# Patient Record
Sex: Female | Born: 1960 | Race: White | Hispanic: No | Smoking: Never smoker
Health system: Southern US, Community
[De-identification: ages and names within clinical notes are randomized; demographics above are authoritative.]

## PROBLEM LIST (undated history)

## (undated) DIAGNOSIS — S0300XA Dislocation of jaw, unspecified side, initial encounter: Secondary | ICD-10-CM

## (undated) DIAGNOSIS — I10 Essential (primary) hypertension: Secondary | ICD-10-CM

## (undated) DIAGNOSIS — M1990 Unspecified osteoarthritis, unspecified site: Secondary | ICD-10-CM

## (undated) HISTORY — PX: TEMPOROMANDIBULAR JOINT SURGERY: SHX35

## (undated) HISTORY — PX: KNEE SURGERY: SHX244

## (undated) HISTORY — PX: EYE SURGERY: SHX253

## (undated) HISTORY — PX: APPENDECTOMY: SHX54

---

## 2012-09-23 ENCOUNTER — Emergency Department (INDEPENDENT_AMBULATORY_CARE_PROVIDER_SITE_OTHER): Payer: BC Managed Care – PPO

## 2012-09-23 ENCOUNTER — Emergency Department
Admission: EM | Admit: 2012-09-23 | Discharge: 2012-09-23 | Disposition: A | Discharge status: Home / Self Care | Payer: BC Managed Care – PPO | Source: Home / Self Care | Attending: Family Medicine | Admitting: Family Medicine

## 2012-09-23 ENCOUNTER — Encounter: Payer: Self-pay | Admitting: *Deleted

## 2012-09-23 DIAGNOSIS — Z0389 Encounter for observation for other suspected diseases and conditions ruled out: Secondary | ICD-10-CM

## 2012-09-23 DIAGNOSIS — X58XXXA Exposure to other specified factors, initial encounter: Secondary | ICD-10-CM

## 2012-09-23 DIAGNOSIS — S51812A Laceration without foreign body of left forearm, initial encounter: Secondary | ICD-10-CM

## 2012-09-23 DIAGNOSIS — Z23 Encounter for immunization: Secondary | ICD-10-CM

## 2012-09-23 DIAGNOSIS — S0181XA Laceration without foreign body of other part of head, initial encounter: Secondary | ICD-10-CM

## 2012-09-23 DIAGNOSIS — S6990XA Unspecified injury of unspecified wrist, hand and finger(s), initial encounter: Secondary | ICD-10-CM

## 2012-09-23 DIAGNOSIS — S59909A Unspecified injury of unspecified elbow, initial encounter: Secondary | ICD-10-CM

## 2012-09-23 DIAGNOSIS — S0180XA Unspecified open wound of other part of head, initial encounter: Secondary | ICD-10-CM

## 2012-09-23 HISTORY — DX: Dislocation of jaw, unspecified side, initial encounter: S03.00XA

## 2012-09-23 HISTORY — DX: Unspecified osteoarthritis, unspecified site: M19.90

## 2012-09-23 MED ORDER — TETANUS-DIPHTH-ACELL PERTUSSIS 5-2.5-18.5 LF-MCG/0.5 IM SUSP
0.5000 mL | Freq: Once | INTRAMUSCULAR | Status: AC
  Administered 2012-09-23: 0.5 mL via INTRAMUSCULAR

## 2012-09-23 NOTE — ED Provider Notes (Signed)
CSN: [id number]     Arrival date & time 09/23/12  1339 History     First MD Initiated Contact with Patient 09/23/12 1348     Chief Complaint  Patient presents with  . Arm Injury  . Facial Injury    HPI  L forearm and face injury x 2 days Pt was trying to remove storm window at one of her properties and window broke.  Had pieces of glass to fall on her.  Fell on L forearm and face  Mild pain  No numbness  Has had some pain with wrist flexion and extension No loss of ROM Neurovascularly intact.    No past medical history on file. No past surgical history on file. No family history on file. History  Substance Use Topics  . Smoking status: Not on file  . Smokeless tobacco: Not on file  . Alcohol Use: Not on file   OB History   No data available     Review of Systems  All other systems reviewed and are negative.    Allergies  Review of patient's allergies indicates not on file.  Home Medications  No current outpatient prescriptions on file. BP 164/93  Pulse 86  Temp(Src) 98.2 F (36.8 C) (Oral)  Resp 16  Wt 123 lb (55.792 kg)  SpO2 99% Physical Exam  Constitutional: She appears well-developed and well-nourished.  HENT:  Head: Normocephalic and atraumatic.    Faint <0.5 cm facial laceration  Minimal peripheral swelling    Eyes: Conjunctivae are normal. Pupils are equal, round, and reactive to light.  Neck: Normal range of motion.  Cardiovascular: Normal rate and regular rhythm.   Pulmonary/Chest: Effort normal and breath sounds normal.  Abdominal: Soft.  Musculoskeletal:       Arms: Mild L radial sided mid forearm laceration <0.5 in length.  Mild peripheral swelling Full wrist ROM  Neurovascularly intact  MIld TTP over affected area   Neurological: She is alert.  Skin: Skin is warm.    ED Course   Procedures (including critical care time)  Labs Reviewed - No data to display Dg Facial Bones 1-2 Views  09/23/2012   *RADIOLOGY REPORT*   Clinical Data: Looking for glass over left face.  FACIAL BONES - 1-2 VIEW  Comparison: None.  Findings: Paranasal sinuses are well-developed and well-aerated. There is no acute fracture.  No definite radiopaque foreign body is seen within the soft tissues.  There are degenerative changes of the spine.  IMPRESSION: No evidence of radiopaque foreign body.  No acute findings.   Original Report Authenticated By: Elberta Fortis, M.D.   Dg Forearm Left  09/23/2012   *RADIOLOGY REPORT*  Clinical Data:  left forearm injury with glass, rule out foreign body.  LEFT FOREARM - 2 VIEW  Comparison: None.  Findings: Two views of the left forearm submitted.  No acute fracture or subluxation.  No radiopaque foreign body is identified. Mild soft tissue swelling adjacent to distal radial region.  IMPRESSION: No acute fracture or subluxation.  No radiopaque foreign body is identified.   Original Report Authenticated By: Natasha Mead, M.D.   1. Facial laceration, initial encounter   2. Forearm laceration, left, initial encounter     MDM  No foreign bodies present on imaging  Discussed general care and MSK/derm/infectious red flags Affected areas cleansed at bedside.  Tdap given.  Follow up as needed.     The patient and/or caregiver has been counseled thoroughly with regard to treatment plan and/or medications prescribed  including dosage, schedule, interactions, rationale for use, and possible side effects and they verbalize understanding. Diagnoses and expected course of recovery discussed and will return if not improved as expected or if the condition worsens. Patient and/or caregiver verbalized understanding.       Doree Albee, MD 09/23/12 [phone]

## 2012-09-23 NOTE — ED Notes (Signed)
Pt reports that a window fell onto her LT forearm and LT side of her face x 1 day ago causing a small laceration and swelling. Tdap will be given today.

## 2013-04-06 ENCOUNTER — Emergency Department
Admission: EM | Admit: 2013-04-06 | Discharge: 2013-04-06 | Disposition: A | Discharge status: Home / Self Care | Payer: BC Managed Care – PPO | Source: Home / Self Care | Attending: Emergency Medicine | Admitting: Emergency Medicine

## 2013-04-06 ENCOUNTER — Emergency Department (INDEPENDENT_AMBULATORY_CARE_PROVIDER_SITE_OTHER): Payer: BC Managed Care – PPO

## 2013-04-06 ENCOUNTER — Encounter: Payer: Self-pay | Admitting: Emergency Medicine

## 2013-04-06 DIAGNOSIS — S0003XA Contusion of scalp, initial encounter: Secondary | ICD-10-CM

## 2013-04-06 DIAGNOSIS — R221 Localized swelling, mass and lump, neck: Secondary | ICD-10-CM

## 2013-04-06 DIAGNOSIS — S1093XA Contusion of unspecified part of neck, initial encounter: Secondary | ICD-10-CM

## 2013-04-06 DIAGNOSIS — S0083XA Contusion of other part of head, initial encounter: Secondary | ICD-10-CM

## 2013-04-06 DIAGNOSIS — R220 Localized swelling, mass and lump, head: Secondary | ICD-10-CM

## 2013-04-06 DIAGNOSIS — S0990XA Unspecified injury of head, initial encounter: Secondary | ICD-10-CM

## 2013-04-06 MED ORDER — IBUPROFEN 200 MG PO TABS
ORAL_TABLET | ORAL | Status: DC
Start: 2013-04-06 — End: 2015-05-14

## 2013-04-06 NOTE — ED Provider Notes (Addendum)
CSN: [id number]     Arrival date & time 04/06/13  1516 History   First MD Initiated Contact with Patient 04/06/13 1538     Chief Complaint  Patient presents with  . Facial Injury    Patient is a 53 y.o. female presenting with facial injury. The history is provided by the patient.  Facial Injury Mechanism of injury:  Fall Location:  Face, L cheek, R cheek and chin (And left orbit) Time since incident:  7 days Pain details:    Quality:  Sharp   Severity:  Severe   Timing:  Constant   Progression:  Worsening Chronicity:  New Foreign body present:  No foreign bodies Relieved by:  Nothing Ineffective treatments:  None tried Associated symptoms: no altered mental status, no congestion, no difficulty breathing, no double vision, no ear pain, no epistaxis, no headaches, no loss of consciousness, no malocclusion, no nausea, no neck pain, no rhinorrhea, no trismus, no vomiting and no wheezing   Associated symptoms comment:  Left eye/orbit pain and swelling Risk factors: no concern for non-accidental trauma (I questioned her, and she specifically denied any assault or abuse history)    Meghan SquibbJane reports falling 7 days Hale @ night tripping over a rug and hit the left facial area. Reports no loss of consciousness or visual changes. She has bruising under both orbits and on her left jaw.   Past Medical History  Diagnosis Date  . TMJ (dislocation of temporomandibular joint)   . Arthritis    Past Surgical History  Procedure Laterality Date  . Knee surgery    . Eye surgery    . Temporomandibular joint surgery    . Appendectomy     Family History  Problem Relation Age of Onset  . Parkinson's disease Mother    History  Substance Use Topics  . Smoking status: Never Smoker   . Smokeless tobacco: Not on file  . Alcohol Use: Yes   OB History   Grav Para Term Preterm Abortions TAB SAB Ect Mult Living                 Review of Systems  HENT: Negative for congestion, ear pain, nosebleeds and  rhinorrhea.   Eyes: Negative for double vision.  Respiratory: Negative for wheezing.   Gastrointestinal: Negative for nausea and vomiting.  Musculoskeletal: Negative for neck pain.  Neurological: Negative for loss of consciousness and headaches.  All other systems reviewed and are negative.      Allergies  Codeine and Prednisone  Home Medications   Current Outpatient Rx  Name  Route  Sig  Dispense  Refill  . ibuprofen (ADVIL,MOTRIN) 200 MG tablet      Take three tablets ( 600 milligrams total) every 6 with food as needed for pain.   30 tablet   0    BP 167/96  Pulse 92  Resp 16  Wt 125 lb (56.7 kg)  SpO2 98% Physical Exam  Nursing note and vitals reviewed. Constitutional: She is oriented to person, place, and time. She appears well-developed and well-nourished. No distress.  Uncomfortable from facial pain.  She has prominent ecchymosis diffusely on face  HENT:  Head: Normocephalic. Head is with raccoon's eyes.    Right Ear: Ear canal normal. No drainage or swelling.  Left Ear: Ear canal normal. No drainage or swelling.  Nose: Nose normal.  Mouth/Throat: Oropharynx is clear and moist.  Ecchymosis and swelling as depicted. Especially tender over superior left orbit. Globes intact  Eyes: Conjunctivae  and EOM are normal. Pupils are equal, round, and reactive to light. Right eye exhibits no discharge. Left eye exhibits no discharge. Right conjunctiva has no hemorrhage. Left conjunctiva has no hemorrhage. No scleral icterus.  Fundoscopic exam:      The right eye shows no exudate, no hemorrhage and no papilledema.       The left eye shows no exudate, no hemorrhage and no papilledema.    Swollen, ecchymotic, as depicted  Neck: Normal range of motion and full passive range of motion without pain. Neck supple. No tracheal tenderness, no spinous process tenderness and no muscular tenderness present. No tracheal deviation and normal range of motion present. No mass present.   Cardiovascular: Normal rate and regular rhythm.   Pulmonary/Chest: Effort normal.  Abdominal: Soft. She exhibits no distension. There is no tenderness.  Musculoskeletal: Normal range of motion.  No deformity or tenderness or swelling of extremities  Neurological: She is alert and oriented to person, place, and time. She has normal strength. No cranial nerve deficit or sensory deficit. Gait normal.  Skin: Skin is warm. No laceration and no rash noted.  Psychiatric: She has a normal mood and affect. Her behavior is normal.   Visual acuity tested each eye, within normal limits each eye  ED Course  Procedures (including critical care time) Labs Review Labs Reviewed - No data to display Imaging Review Ct Maxillofacial Wo Cm  04/06/2013   CLINICAL DATA:  Hit face on bed frame.  Pain.  Bruising.  EXAM: CT MAXILLOFACIAL WITHOUT CONTRAST  TECHNIQUE: Multidetector CT imaging of the maxillofacial structures was performed. Multiplanar CT image reconstructions were also generated. A small vitamin-E capsule was placed on the right temple in order to reliably differentiate right from left.  COMPARISON:  None.  FINDINGS: There is no visible facial fracture. There is a large scalp hematoma in the left supraorbital region measuring 11 x 25 mm cross-section as seen on image 11 series 2. There is no underlying skull fracture. Within limits of visualization of the intracranial compartment, no intracranial hemorrhage although premature for age cerebral atrophy is apparent.  The globes are intact and there is no postseptal hemorrhage. Mild preseptal periorbital soft tissue swelling is noted on the left.  No sinus air-fluid level is evident. There is no apparent blowout injury. Nasal bones are intact. No nasal cavity masses. Mild malar soft tissue swelling on the left. TMJs appear located. Within limits of detection due to dental amalgam, no mandibular or maxillary fracture is evident.  IMPRESSION: Soft tissue swelling  with supraorbital scalp hematoma on the left face. No underlying skull fracture or facial fracture. No visible blowout injury or sinus air-fluid level. No postseptal orbital hematoma or visible globe injury.  Premature for age cerebral atrophy is incompletely evaluated on this study, but there is no visible intracranial hemorrhage in the anterior cranial vault.   Electronically Signed   By: Davonna Belling M.D.   On: 04/06/2013 16:49     4:53 PM Discussed indications for imaging with patient. She agrees with my advice of ordering CT of face.--Facial CT ordered.  MDM   Final diagnoses:  Facial contusion  Head injury, closed    Reviewed with patient that CT of head shows no acute intracranial abnormality. There is soft tissue swelling with supraorbital scalp hematoma on the left face. We discussed the treatment for this is symptomatic and the swelling and ecchymosis will resolve with time. Nonpharmacologic measures discussed She declined any prescription pain medication, but may  use ibuprofen up to 600 mg 3 times a day when necessary pain. Follow-up with ENT or your primary care doctor in 3-5 days if not improving, or sooner if symptoms become worse. Precautions discussed. Red flags discussed. Questions invited and answered. Patient voiced understanding and agreement.     Lajean Manes, MD 04/06/13 1655  Lajean Manes, MD 04/06/13 1656  Lajean Manes, MD 04/06/13 [phone]

## 2013-04-06 NOTE — ED Notes (Signed)
Meghan SquibbJane reports falling 8 days ago @ night tripping over a rug and hit the left brown bone. Reports no loss of consciousness or visual changes. She has bruising under both orbits and on her left jaw.

## 2013-11-29 ENCOUNTER — Encounter: Payer: Self-pay | Admitting: Emergency Medicine

## 2013-11-29 ENCOUNTER — Emergency Department
Admission: EM | Admit: 2013-11-29 | Discharge: 2013-11-29 | Disposition: A | Discharge status: Home / Self Care | Payer: BC Managed Care – PPO | Source: Home / Self Care | Attending: Emergency Medicine | Admitting: Emergency Medicine

## 2013-11-29 DIAGNOSIS — B029 Zoster without complications: Secondary | ICD-10-CM

## 2013-11-29 MED ORDER — VALACYCLOVIR HCL 1 G PO TABS: ORAL_TABLET | ORAL | Status: DC

## 2013-11-29 MED ORDER — METHYLPREDNISOLONE 4 MG PO KIT: PACK | ORAL | Status: DC

## 2013-11-29 NOTE — ED Provider Notes (Signed)
CSN: [id number]     Arrival date & time 11/29/13  1453 History   First MD Initiated Contact with Patient 11/29/13 1506     Chief Complaint  Patient presents with  . Rash   (Consider location/radiation/quality/duration/timing/severity/associated sxs/prior Treatment) Patient is a 53 y.o. female presenting with rash. The history is provided by the patient.  Rash Pain location:  L flank Pain quality comment:  Mild discomfort, mild itch Pain radiation: Along left flank dermatome. Pain severity:  Mild Onset quality:  Gradual Duration:  2 days Progression:  Worsening Chronicity:  New Context: not recent illness, not recent travel, not sick contacts, not suspicious food intake and not trauma   Relieved by:  Nothing Associated symptoms: fatigue   Associated symptoms: no chest pain, no chills, no cough, no diarrhea, no dysuria, no fever, no hematemesis, no hematochezia, no hematuria, no melena, no nausea, no shortness of breath, no sore throat, no vaginal bleeding and no vomiting     Past Medical History  Diagnosis Date  . TMJ (dislocation of temporomandibular joint)   . Arthritis    Past Surgical History  Procedure Laterality Date  . Knee surgery    . Eye surgery    . Temporomandibular joint surgery    . Appendectomy     Family History  Problem Relation Age of Onset  . Parkinson's disease Mother    History  Substance Use Topics  . Smoking status: Never Smoker   . Smokeless tobacco: Not on file  . Alcohol Use: Yes   OB History   Grav Para Term Preterm Abortions TAB SAB Ect Mult Living                 Review of Systems  Constitutional: Positive for fatigue. Negative for fever and chills.  HENT: Negative for sore throat.   Respiratory: Negative for cough and shortness of breath.   Cardiovascular: Negative for chest pain.  Gastrointestinal: Negative for nausea, vomiting, diarrhea, melena, hematochezia and hematemesis.  Genitourinary: Negative for dysuria, hematuria and  vaginal bleeding.  Skin: Positive for rash.  All other systems reviewed and are negative.   Allergies  Codeine and Prednisone  Home Medications   Prior to Admission medications   Medication Sig Start Date End Date Taking? Authorizing Provider  ibuprofen (ADVIL,MOTRIN) 200 MG tablet Take three tablets ( 600 milligrams total) every 6 with food as needed for pain. 04/06/13   Lajean Manes, MD  methylPREDNISolone (MEDROL DOSEPAK) 4 MG tablet Take as directed for 6 days 11/29/13   Lajean Manes, MD  valACYclovir (VALTREX) 1000 MG tablet Take one by mouth 3 times a day for 7 days 11/29/13   Lajean Manes, MD   BP 147/82  Pulse 90  Temp(Src) 98 F (36.7 C) (Oral)  Resp 16  Ht 5\' 4"  (1.626 m)  Wt 123 lb (55.792 kg)  BMI 21.10 kg/m2  SpO2 100% Physical Exam  Nursing note and vitals reviewed. Constitutional: She is oriented to person, place, and time. She appears well-developed and well-nourished. No distress.  HENT:  Head: Normocephalic and atraumatic.  Eyes: Conjunctivae and EOM are normal. Pupils are equal, round, and reactive to light. No scleral icterus.  Neck: Normal range of motion.  Cardiovascular: Normal rate.   Pulmonary/Chest: Effort normal.  Abdominal: She exhibits no distension.  Musculoskeletal: Normal range of motion.  Neurological: She is alert and oriented to person, place, and time.  Skin: Skin is warm. Rash noted.     Herpetiform, slight vesicular, red eruption in  dermatome distribution left flank. It does not cross midline  Psychiatric: She has a normal mood and affect.    ED Course  Procedures (including critical care time) Labs Review Labs Reviewed - No data to display  Imaging Review No results found.   MDM   1. Shingles    Over 30 minutes spent, greater than 50% of the time spent for counseling and coordination of care. Treatment options discussed, as well as risks, benefits, alternatives. Patient voiced understanding and agreement with the following  plans: Valtrex 1 g 3 times a day x7 days. Pros and cons of steroids to help prevent postherpetic neuralgia. Carefully reviewed with her that in the past she once had some mild palpitations while on high-dose prednisone for a long period of time, but that resolved without sequelae. After lengthy discussion, of pros and cons, she requests Medrol Dosepak. Tylenol or ibuprofen for pain. She declined any prescription pain medication. Handout given. Advised to get the shingles vaccine once this is resolved. Follow-up with your primary care doctor in 5-7 days if not improving, or sooner if symptoms become worse. Precautions discussed. Red flags discussed. Questions invited and answered. Patient voiced understanding and agreement.      Lajean Manesavid Massey, MD 11/29/13 [phone]

## 2013-11-29 NOTE — ED Notes (Signed)
Pt c/o rash on the LT side of her abdomen x 4 days.

## 2014-08-22 IMAGING — CR DG FACIAL BONES 1-2V
3 series · 3 of 3 positions shown · non-contrast
Comparison: None.

CLINICAL DATA: Looking for glass over left face.

FACIAL BONES - 1-2 VIEW

[view not recorded (1 of 3)]
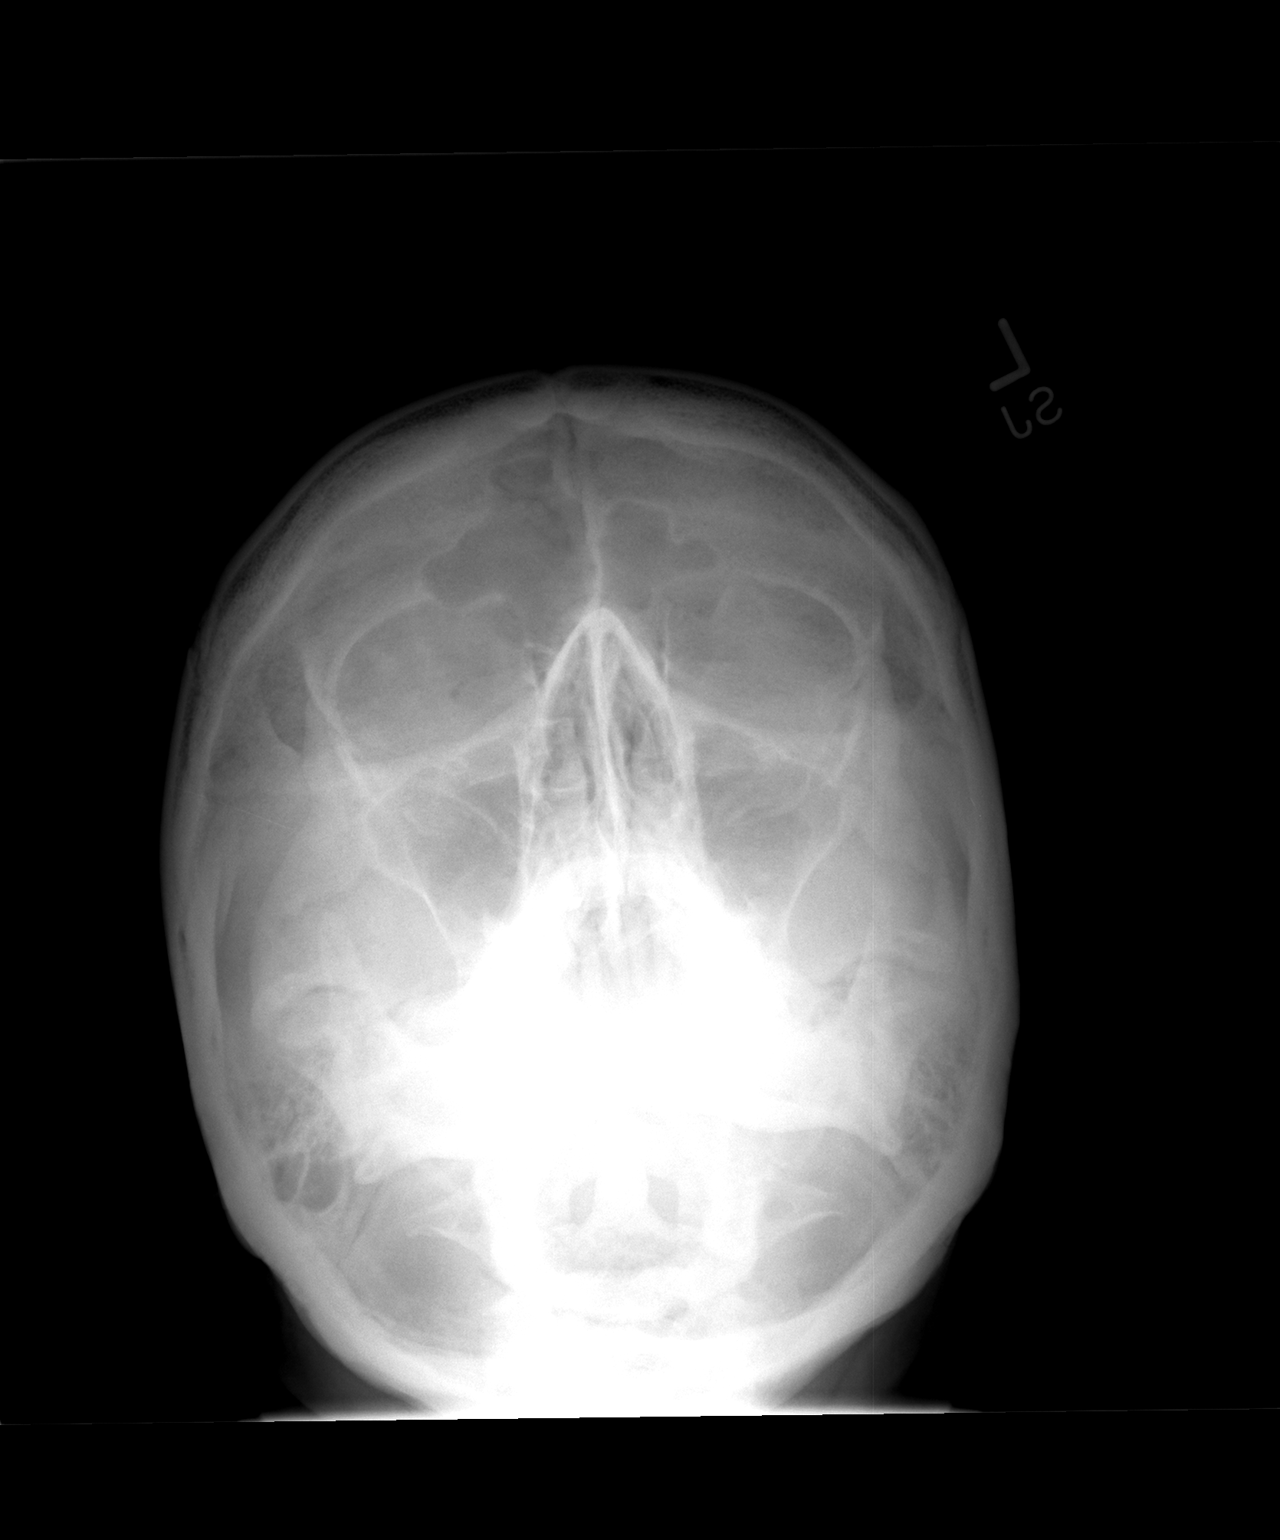

[view not recorded (2 of 3)]
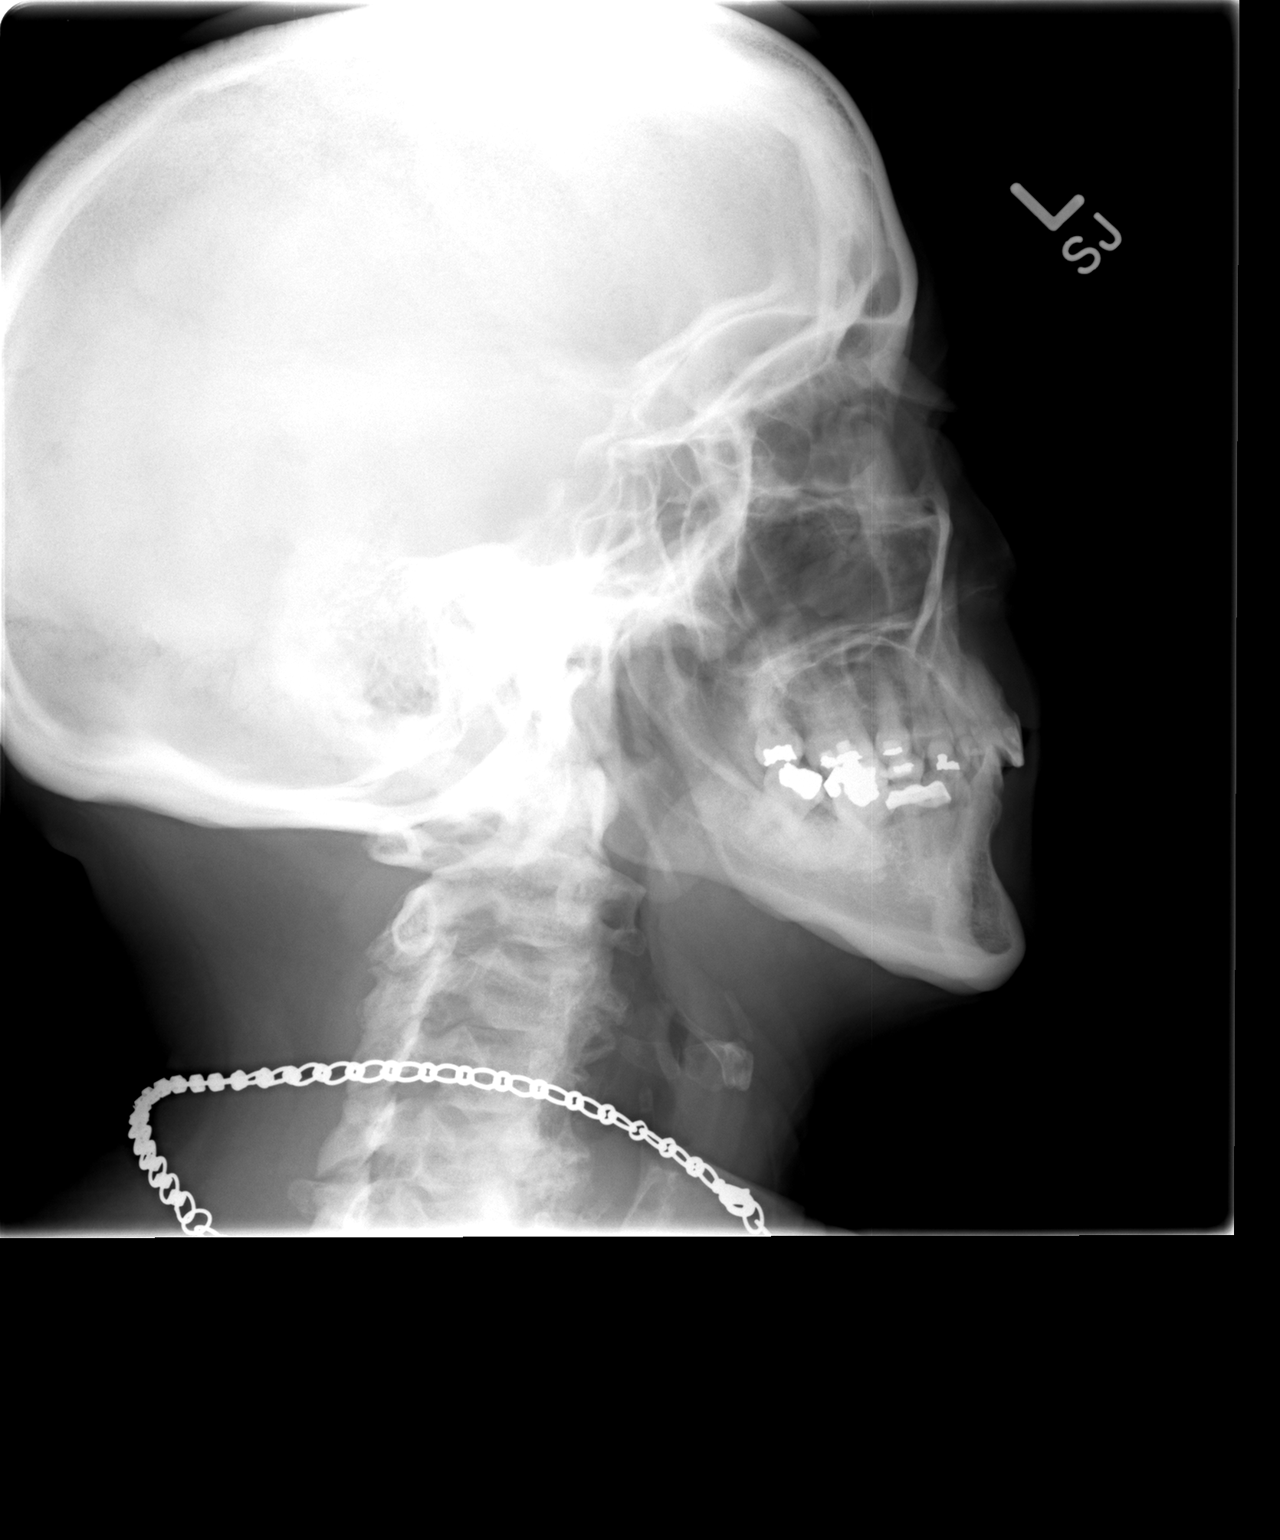

[view not recorded (3 of 3)]
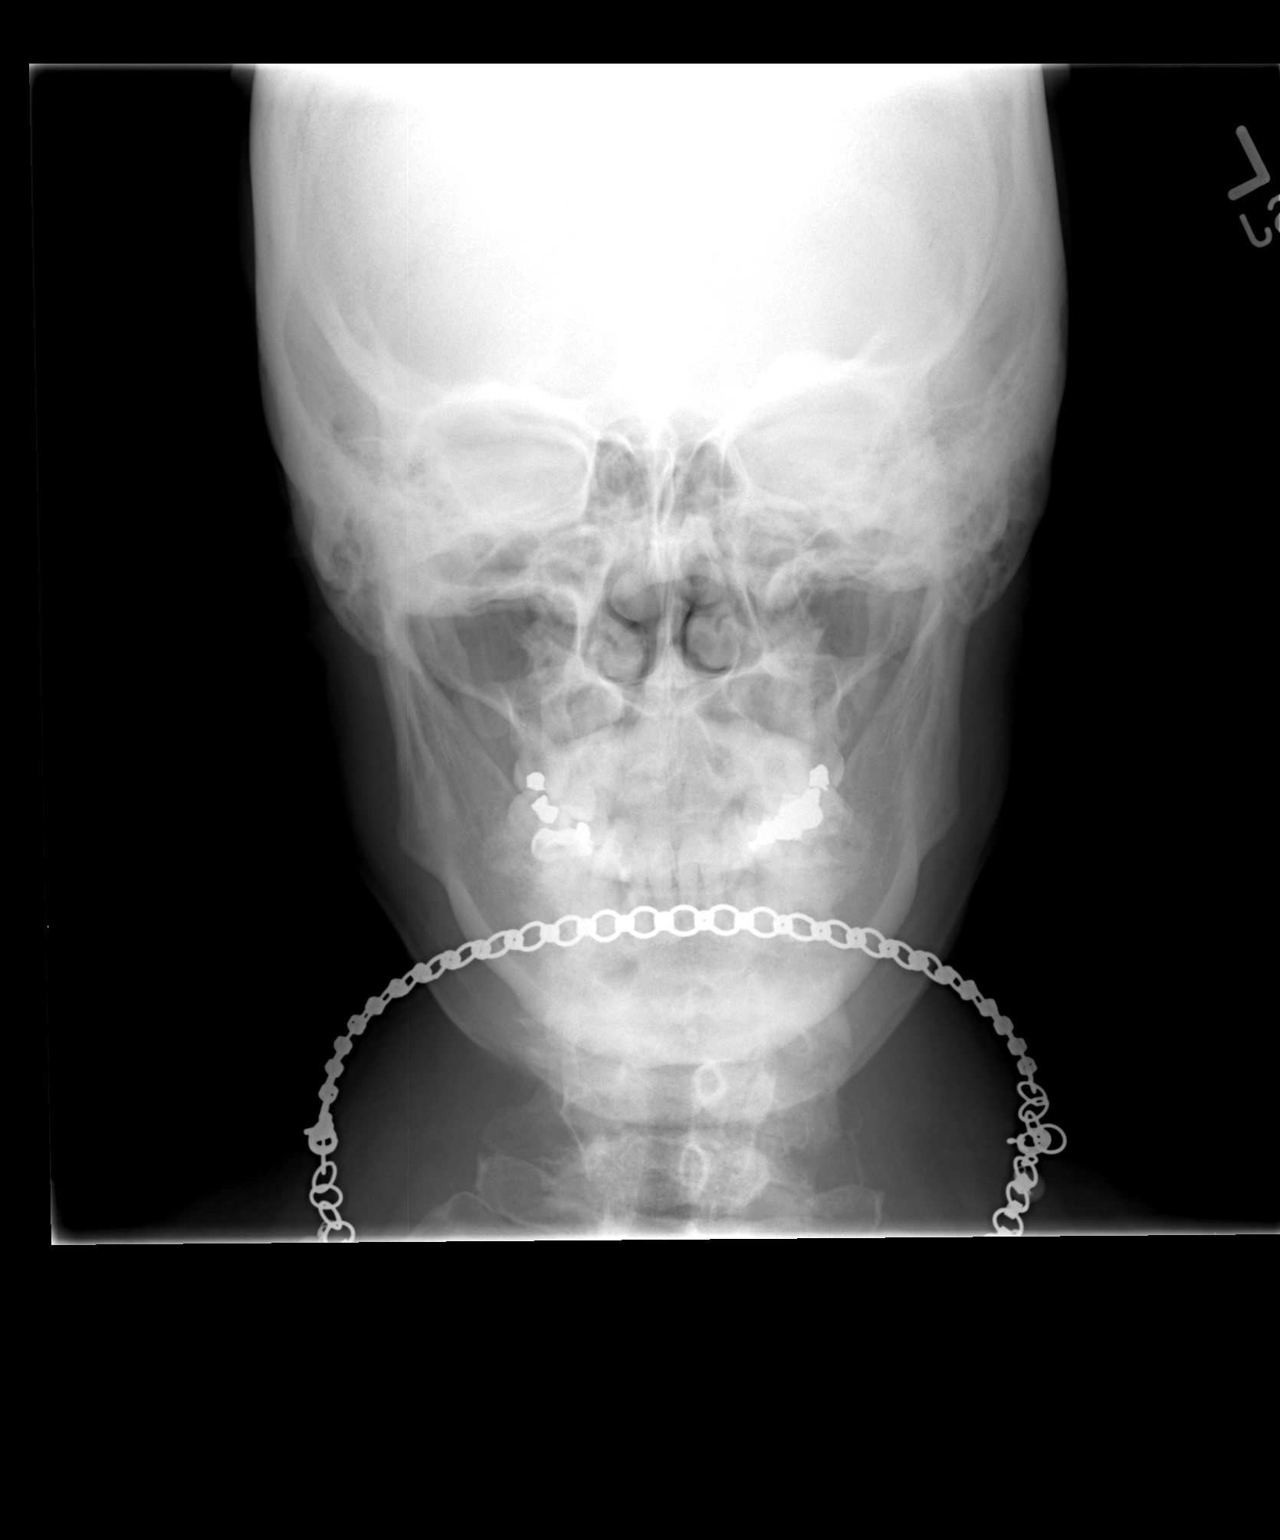

[3 of 3 positions shown; findings below may reference images not displayed]

FINDINGS: Paranasal sinuses are well-developed and well-aerated.
There is no acute fracture.  No definite radiopaque foreign body is
seen within the soft tissues.  There are degenerative changes of
the spine.
IMPRESSION: No evidence of radiopaque foreign body.  No acute findings.

## 2015-05-14 ENCOUNTER — Emergency Department (INDEPENDENT_AMBULATORY_CARE_PROVIDER_SITE_OTHER): Payer: BLUE CROSS/BLUE SHIELD

## 2015-05-14 ENCOUNTER — Encounter: Payer: Self-pay | Admitting: Emergency Medicine

## 2015-05-14 ENCOUNTER — Emergency Department
Admission: EM | Admit: 2015-05-14 | Discharge: 2015-05-14 | Disposition: A | Discharge status: Home / Self Care | Payer: BLUE CROSS/BLUE SHIELD | Source: Home / Self Care

## 2015-05-14 DIAGNOSIS — J111 Influenza due to unidentified influenza virus with other respiratory manifestations: Secondary | ICD-10-CM | POA: Diagnosis not present

## 2015-05-14 DIAGNOSIS — R05 Cough: Secondary | ICD-10-CM | POA: Diagnosis not present

## 2015-05-14 DIAGNOSIS — R0781 Pleurodynia: Secondary | ICD-10-CM | POA: Diagnosis not present

## 2015-05-14 DIAGNOSIS — R69 Illness, unspecified: Principal | ICD-10-CM

## 2015-05-14 MED ORDER — AZITHROMYCIN 250 MG PO TABS: ORAL_TABLET | ORAL | Status: DC

## 2015-05-14 MED ORDER — BENZONATATE 200 MG PO CAPS: 200.0000 mg | ORAL_CAPSULE | Freq: Every day | ORAL | Status: DC

## 2015-05-14 NOTE — ED Provider Notes (Signed)
CSN: [id number]     Arrival date & time 05/14/15  1144 History   None    Chief Complaint  Patient presents with  . Cough      HPI Comments: Complains of one week history of flu-like illness including myalgias, headache, fever/chills, fatigue, and cough.  Also has mild nasal congestion and sore throat.  Cough is non-productive and somewhat worse at night.  She has developed pleuritic pain in her right anterior/inferior ribs.     The history is provided by the patient.    Past Medical History  Diagnosis Date  . TMJ (dislocation of temporomandibular joint)   . Arthritis    Past Surgical History  Procedure Laterality Date  . Knee surgery    . Eye surgery    . Temporomandibular joint surgery    . Appendectomy     Family History  Problem Relation Age of Onset  . Parkinson's disease Mother    Social History  Substance Use Topics  . Smoking status: Never Smoker   . Smokeless tobacco: None  . Alcohol Use: Yes   OB History    No data available     Review of Systems + sore throat + cough + pleuritic pain No wheezing + nasal congestion + post-nasal drainage + sinus pain/pressure No itchy/red eyes ? earache No hemoptysis No SOB + fever, + chills No nausea + vomiting, resolved No abdominal pain + diarrhea, resolved No urinary symptoms No skin rash + fatigue No myalgias + headache Used OTC meds without relief  Allergies  Codeine and Prednisone  Home Medications   Prior to Admission medications   Medication Sig Start Date End Date Taking? Authorizing Provider  azithromycin (ZITHROMAX Z-PAK) 250 MG tablet Take 2 tabs today; then begin one tab once daily for 4 more days. 05/14/15   Lattie HawStephen A Fadel Clason, MD  benzonatate (TESSALON) 200 MG capsule Take 1 capsule (200 mg total) by mouth at bedtime. Take as needed for cough 05/14/15   Lattie HawStephen A Smayan Hackbart, MD   Meds Ordered and Administered this Visit  Medications - No data to display  BP 148/91 mmHg  Pulse 83  Temp(Src) 97.7  F (36.5 C) (Oral)  Ht 5\' 4"  (1.626 m)  Wt 119 lb (53.978 kg)  BMI 20.42 kg/m2  SpO2 100% No data found.   Physical Exam  Constitutional: She is oriented to person, place, and time. She appears well-developed and well-nourished. No distress.  HENT:  Head: Normocephalic.  Right Ear: Tympanic membrane, external ear and ear canal normal.  Left Ear: Tympanic membrane, external ear and ear canal normal.  Nose: Nose normal.  Mouth/Throat: Oropharynx is clear and moist.  Eyes: Conjunctivae and EOM are normal. Pupils are equal, round, and reactive to light.  Neck: Neck supple.  Tender posterior/lateral nodes bilaterally  Cardiovascular: Normal heart sounds.   Pulmonary/Chest: Breath sounds normal.    There is mild swelling, tenderness, ecchymosis right anterior inferior ribs as noted on diagram.   Faint wheezes right posterior inferior chest.    Abdominal: There is no tenderness.  Musculoskeletal: She exhibits no edema.  Neurological: She is alert and oriented to person, place, and time.  Skin: Skin is warm and dry. No rash noted. She is not diaphoretic.  Nursing note and vitals reviewed.   ED Course  Procedures none   Imaging Review Dg Ribs Unilateral W/chest Right  05/14/2015  CLINICAL DATA:  Productive cough for the past week. Right chest pain beneath the breast. EXAM: RIGHT RIBS AND  CHEST - 3+ VIEW COMPARISON:  None. FINDINGS: Normal sized heart. Clear lungs. Normal appearing ribs. Mild-to-moderate right AC joint inferior spur formation. IMPRESSION: No acute abnormality. Electronically Signed   By: Beckie Salts M.D.   On: 05/14/2015 14:24      MDM   1. Influenza-like illness   2. Rib pain on right side    Begin Z-pak for atypical coverage (missed window of opportunity for Tamiflu). Prescription written for Benzonatate [facility]) to take at bedtime for night-time cough.  Apply ice pack to right chest for 20 to 30 minutes, 3 to 4 times daily  Continue until pain and  swelling decrease. Take plain guaifenesin (  extended release tabs such as Mucinex) twice daily, with plenty of water, for cough and congestion.  May add Pseudoephedrine ( , one or two every 4 to 6 hours) for sinus congestion.  Get adequate rest.   May use Afrin nasal spray (or generic oxymetazoline) twice daily for about 5 days and then discontinue.  Also recommend using saline nasal spray several times daily and saline nasal irrigation (AYR is a common brand).  Use Flonase nasal spray each morning after using Afrin nasal spray and saline nasal irrigation. Try warm salt water gargles for sore throat.  Stop all antihistamines for now, and other non-prescription cough/cold preparations. May take Ibuprofen , 4 tabs every 8 hours with food for chest/sternum discomfort.   Follow-up with family doctor if not improving about 6 days.     Lattie Haw, MD 05/17/15 1027

## 2015-05-14 NOTE — Discharge Instructions (Signed)
Apply ice pack to right chest for 20 to 30 minutes, 3 to 4 times daily  Continue until pain and swelling decrease. Take plain guaifenesin (1200mg  extended release tabs such as Mucinex) twice daily, with plenty of water, for cough and congestion.  May add Pseudoephedrine (30mg , one or two every 4 to 6 hours) for sinus congestion.  Get adequate rest.   May use Afrin nasal spray (or generic oxymetazoline) twice daily for about 5 days and then discontinue.  Also recommend using saline nasal spray several times daily and saline nasal irrigation (AYR is a common brand).  Use Flonase nasal spray each morning after using Afrin nasal spray and saline nasal irrigation. Try warm salt water gargles for sore throat.  Stop all antihistamines for now, and other non-prescription cough/cold preparations. May take Ibuprofen 200mg , 4 tabs every 8 hours with food for chest/sternum discomfort.   Follow-up with family doctor if not improving about 6 days.

## 2015-05-14 NOTE — ED Notes (Signed)
Pt c/o bad productive cough x 1 week, fluid pocket under right breast, runny nose

## 2016-11-06 ENCOUNTER — Emergency Department
Admission: EM | Admit: 2016-11-06 | Discharge: 2016-11-06 | Disposition: A | Discharge status: Home / Self Care | Payer: BLUE CROSS/BLUE SHIELD | Source: Home / Self Care | Attending: Family Medicine | Admitting: Family Medicine

## 2016-11-06 ENCOUNTER — Encounter: Payer: Self-pay | Admitting: Emergency Medicine

## 2016-11-06 DIAGNOSIS — R030 Elevated blood-pressure reading, without diagnosis of hypertension: Secondary | ICD-10-CM | POA: Diagnosis not present

## 2016-11-06 DIAGNOSIS — W57XXXA Bitten or stung by nonvenomous insect and other nonvenomous arthropods, initial encounter: Secondary | ICD-10-CM

## 2016-11-06 HISTORY — DX: Essential (primary) hypertension: I10

## 2016-11-06 MED ORDER — DEXAMETHASONE SODIUM PHOSPHATE 10 MG/ML IJ SOLN: 10.0000 mg | Freq: Once | INTRAMUSCULAR | Status: DC

## 2016-11-06 MED ORDER — HYDROXYZINE HCL 25 MG PO TABS: 25.0000 mg | ORAL_TABLET | Freq: Four times a day (QID) | ORAL | 0 refills | Status: AC | PRN

## 2016-11-06 NOTE — ED Provider Notes (Signed)
[facility]    CSN: [id number] Arrival date & time: 11/06/16  1826     History   Chief Complaint Chief Complaint  Patient presents with  . Rash    HPI Meghan Hale is a 56 y.o. female.   HPI Meghan Hale is a 56 y.o. female presenting to UC with c/o itching burning insect bites on her arms and back that started 3 days ago. Pt states [place] the beach this past [place] and when she got home, she noticed bed bugs in her pillow.  She has tried OTC benadryl cream but no relief.  Per her PMH she has been prescribed clotrimazole-betamethasone cream for eczema as well as Zyrtec.  She has not used the cream yet.  She is currently on clindamycin, which she has been on for about 4 months, prescribed by her orthopedist for an ankle injury and infection.  Denies fever, chills, n/v/d. Denies oral swelling or SOB.  BP elevated in triage. She is not on any BP medication.  She does have HTN listed in her PMH, however, no mention of BP medication during last Routine Exam with her PCP in July of this year.   Past Medical History:  Diagnosis Date  . Arthritis   . Hypertension   . TMJ (dislocation of temporomandibular joint)     There are no active problems to display for this patient.   Past Surgical History:  Procedure Laterality Date  . APPENDECTOMY    . EYE SURGERY    . KNEE SURGERY    . TEMPOROMANDIBULAR JOINT SURGERY      OB History    No data available       Home Medications    Prior to Admission medications   Medication Sig Start Date End Date Taking? Authorizing Provider  clindamycin (CLEOCIN) 150 MG capsule Take by mouth 3 (three) times daily.   Yes [provider]  hydrOXYzine (ATARAX/VISTARIL) 25 MG tablet Take 1 tablet (25 mg total) by mouth every 6 (six) hours as needed for itching. 11/06/16   Lurene Shadow, PA-C    Family History Family History  Problem Relation Age of Onset  . Parkinson's disease Mother   . Cancer Mother   . Kidney  failure Father     Social History Social History  Substance Use Topics  . Smoking status: Never Smoker  . Smokeless tobacco: Never Used  . Alcohol use Yes     Allergies   Codeine and Prednisone   Review of Systems Review of Systems  Constitutional: Negative for chills and fever.  HENT: Negative for facial swelling.   Respiratory: Negative for shortness of breath and wheezing.   Musculoskeletal: Negative for arthralgias, joint swelling and myalgias.  Skin: Positive for rash. Negative for wound.     Physical Exam Triage Vital Signs ED Triage Vitals  Enc Vitals Group     BP 11/06/16 1857 (!) 172/88     Pulse Rate 11/06/16 1857 85     Resp --      Temp 11/06/16 1857 97.6 F (36.4 C)     Temp Source 11/06/16 1857 Oral     SpO2 11/06/16 1857 99 %     Weight 11/06/16 1858 127 lb (57.6 kg)     Height 11/06/16 1858 Ivar Drape  Head Circumference --      Peak Flow --      Pain Score 11/06/16 1859 4  Pain Loc --      Pain Edu? --      Excl. in GC? --    No data found.   Updated Vital Signs BP (!) 172/88 (BP Location: Left Arm)   Pulse 85   Temp 97.6 F (36.4 C) (Oral)   Ht 5\' 4"  (1.626 m)   Wt 127 lb (57.6 kg)   SpO2 99%   BMI 21.80 kg/m   Visual Acuity Right Eye Distance:   Left Eye Distance:   Bilateral Distance:    Right Eye Near:   Left Eye Near:    Bilateral Near:     Physical Exam  Constitutional: She is oriented to person, place, and time. She appears well-developed and well-nourished. No distress.  HENT:  Head: Normocephalic and atraumatic.  Eyes: EOM are normal.  Neck: Normal range of motion. Neck supple.  Cardiovascular: Normal rate.   Pulmonary/Chest: Effort normal. No stridor. No respiratory distress.  Musculoskeletal: Normal range of motion.  Neurological: She is alert and oriented to person, place, and time.  Skin: Skin is warm and dry. Rash noted. She is not diaphoretic. There is erythema.  Erythematous maculopapular  lesions on back, smaller lesions on abdomen and scant diffuse erythematous papular lesions on Left forearm. No tracts. No lesions on hands or feet.  Psychiatric: She has a normal mood and affect. Her behavior is normal.  Nursing note and vitals reviewed.    UC Treatments / Results  Labs (all labs ordered are listed, but only abnormal results are displayed) Labs Reviewed - No data to display  EKG  EKG Interpretation None       Radiology No results found.  Procedures Procedures (including critical care time)  Medications Ordered in UC Medications  dexamethasone (DECADRON) injection 10 mg (not administered)     Initial Impression / Assessment and Plan / UC Course  I have reviewed the triage vital signs and the nursing notes.  Pertinent labs & imaging results that were available during my care of the patient were reviewed by me and considered in my medical decision making (see chart for details).     Hx and exam c/w bed bug bites. Exam not c/w scabies.  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 No evidence of underlying skin infection on current insect bites. Will treat symptomatically.   Advised pt she may take her Zyrtec and use her betamethasone cream to help with itching, may take Atarax at night, can cause drowsiness.   Decadron 10mg  IM given in UC today F/u with PCP in 1 week if not improving, sooner if worsening and for monitoring of her BP.  Final Clinical Impressions(s) / UC Diagnoses   Final diagnoses:  Insect bite, initial encounter  Elevated blood pressure reading    New Prescriptions New Prescriptions   HYDROXYZINE (ATARAX/VISTARIL) 25 MG TABLET    Take 1 tablet (25 mg total) by mouth every 6 (six) hours as needed for itching.     Controlled Substance Prescriptions [place] Controlled Substance Registry consulted? Not Applicable   Rolla Platehelps, Letty Salvi O, PA-C 11/06/16 1925

## 2016-11-06 NOTE — Discharge Instructions (Signed)
°  Atarax (hydroxizine) is an antihistamine that can be taken to help with itching. This medication can cause drowsiness so do not drive or drink alcohol while taking.    During the day, you may take your Zyrtec to help with itching.  This should not cause drowsiness.  You may use your previously prescribed betamethasone cream to help with itching as well as this is much stronger medication than over the counter hydrocortisone cream.

## 2016-11-06 NOTE — ED Triage Notes (Signed)
Rash on arms and back x 3 days itches and hurts, was given a pillow which had beg bugs in it.

## 2016-12-10 ENCOUNTER — Other Ambulatory Visit: Payer: Self-pay | Admitting: Family Medicine

## 2016-12-10 DIAGNOSIS — Z1239 Encounter for other screening for malignant neoplasm of breast: Secondary | ICD-10-CM

## 2016-12-18 ENCOUNTER — Ambulatory Visit: Payer: BLUE CROSS/BLUE SHIELD

## 2016-12-26 ENCOUNTER — Ambulatory Visit: Payer: BLUE CROSS/BLUE SHIELD

## 2017-01-03 ENCOUNTER — Ambulatory Visit: Payer: BLUE CROSS/BLUE SHIELD

## 2017-01-10 ENCOUNTER — Ambulatory Visit: Payer: BLUE CROSS/BLUE SHIELD

## 2017-04-11 IMAGING — CR DG RIBS W/ CHEST 3+V*R*
3 series · 3 of 3 positions shown · non-contrast
Comparison: None.

CLINICAL DATA: Productive cough for the past week. Right chest pain
beneath the breast.

EXAM:
RIGHT RIBS AND CHEST - 3+ VIEW

[chest pa]
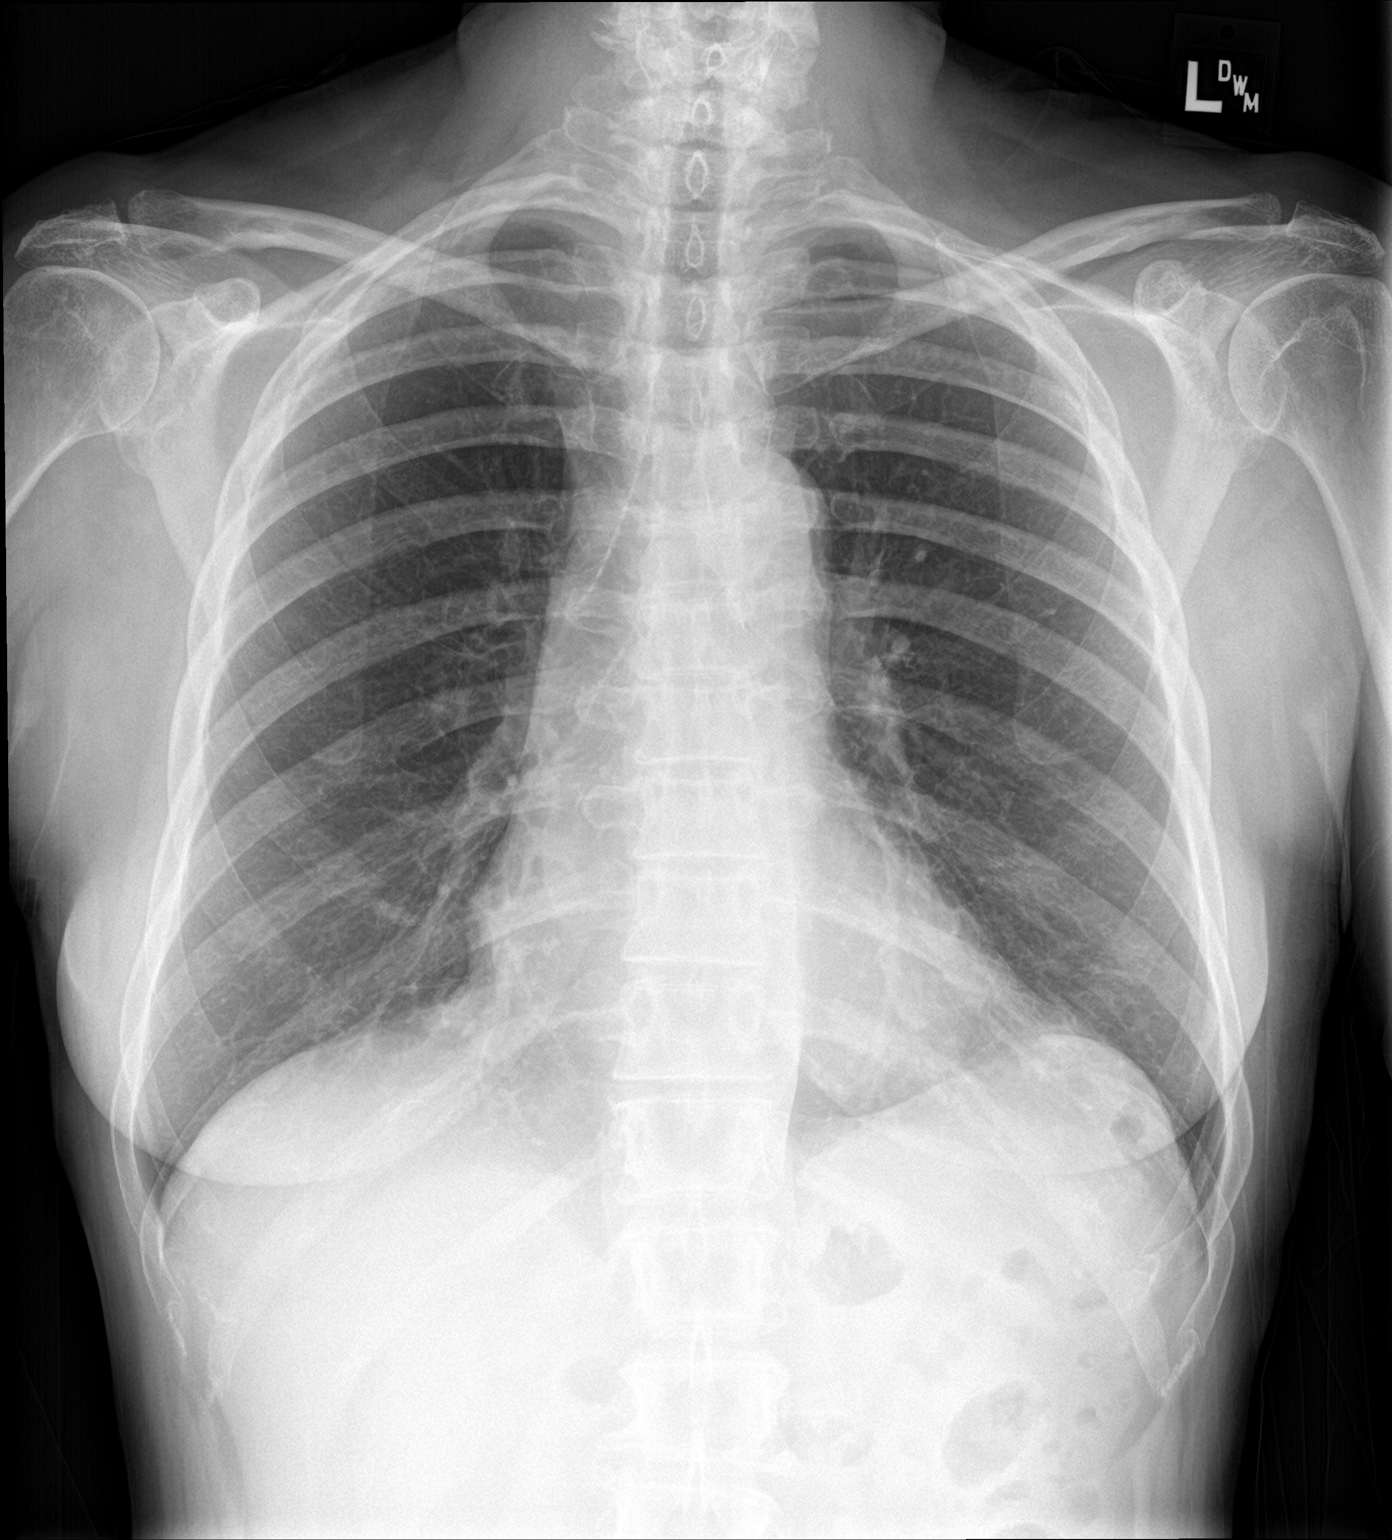

[rib ap]
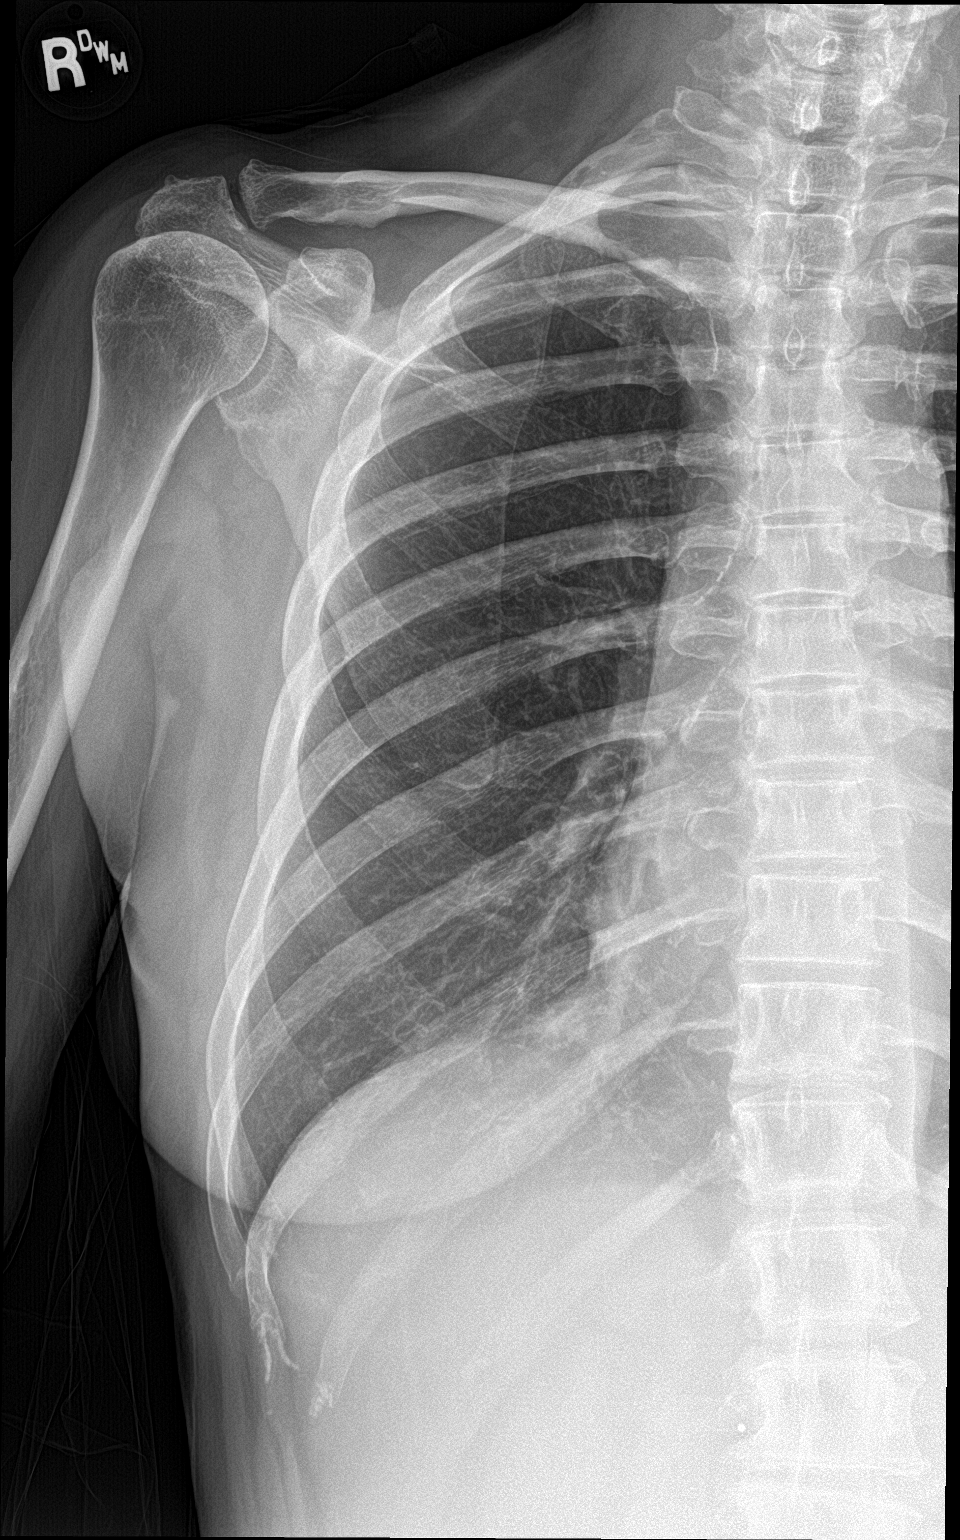

[rib ap obl]
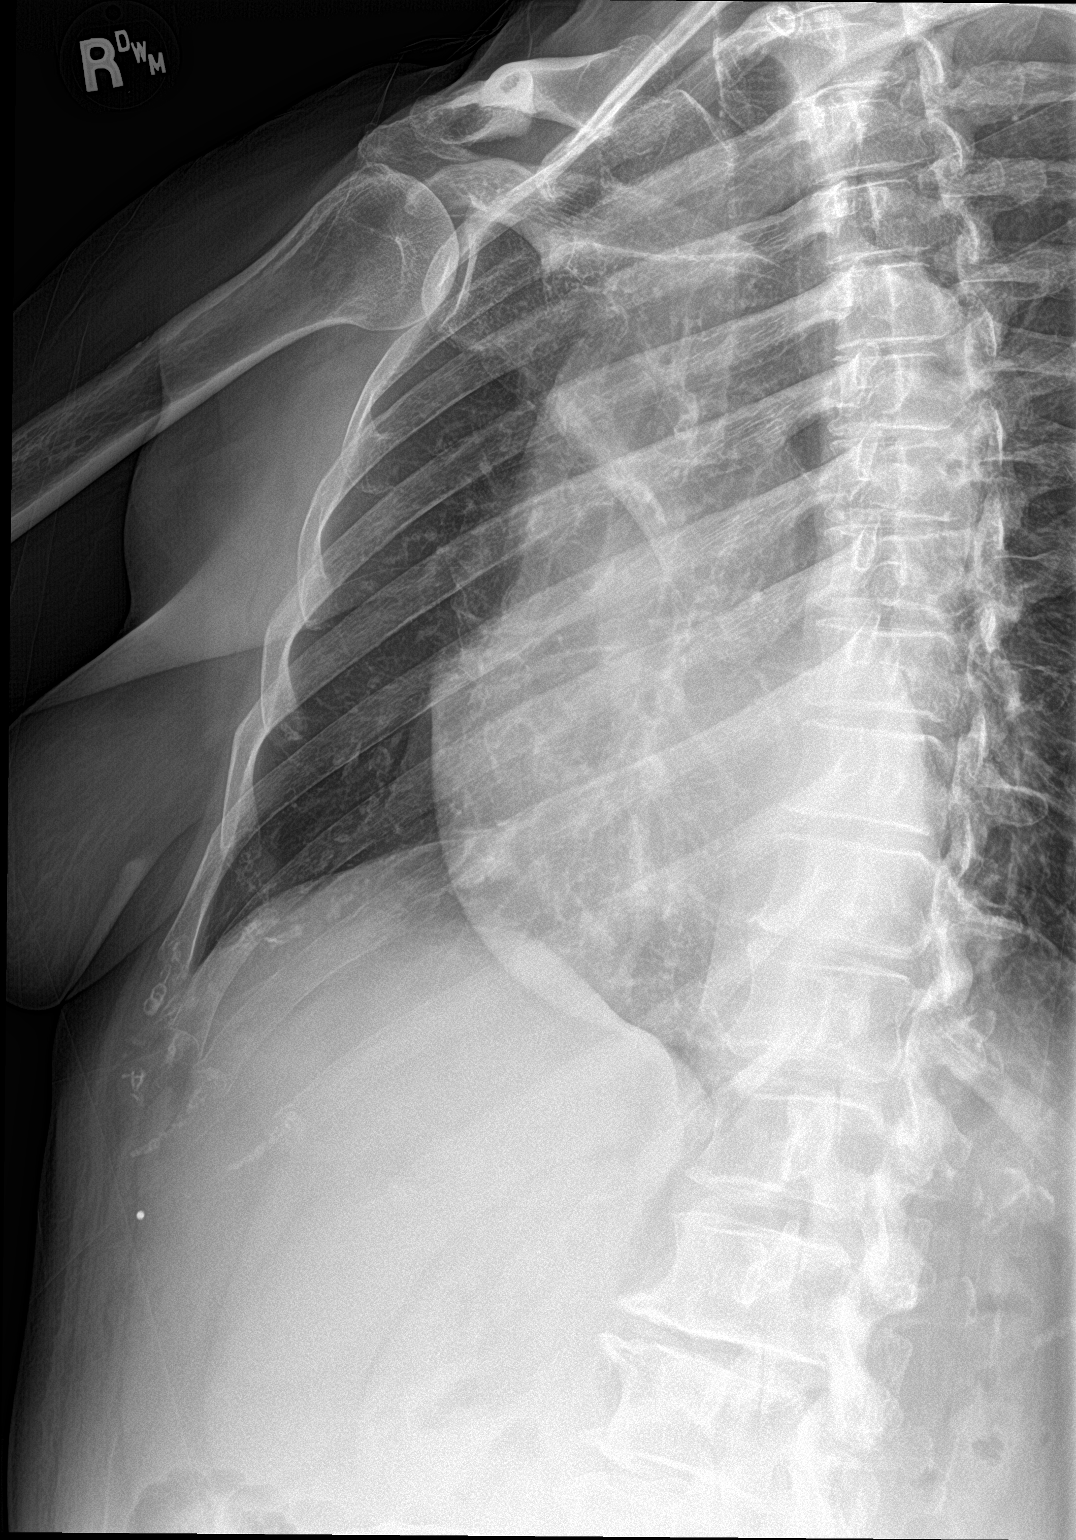

[3 of 3 positions shown; findings below may reference images not displayed]

FINDINGS: Normal sized heart. Clear lungs. Normal appearing ribs.
Mild-to-moderate right AC joint inferior spur formation.
IMPRESSION: No acute abnormality.

## 2020-04-10 ENCOUNTER — Telehealth (HOSPITAL_COMMUNITY): Payer: BLUE CROSS/BLUE SHIELD | Admitting: Psychiatry
